# Patient Record
Sex: Male | Born: 2000 | Race: White | Hispanic: No | Marital: Single | Smoking: Never smoker
Health system: Southern US, Community
[De-identification: ages and names within clinical notes are randomized; demographics above are authoritative.]

---
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
## 2021-05-04 ENCOUNTER — Encounter: Payer: Self-pay | Admitting: Emergency Medicine

## 2021-05-04 ENCOUNTER — Ambulatory Visit
Admission: EM | Admit: 2021-05-04 | Discharge: 2021-05-04 | Disposition: A | Discharge status: Home / Self Care | Payer: 59 | Attending: Family Medicine | Admitting: Family Medicine

## 2021-05-04 DIAGNOSIS — R0981 Nasal congestion: Secondary | ICD-10-CM | POA: Diagnosis not present

## 2021-05-04 MED ORDER — CETIRIZINE HCL 10 MG PO TABS: 10.0000 mg | ORAL_TABLET | Freq: Every day | ORAL | 0 refills | Status: AC

## 2021-05-04 MED ORDER — FLUTICASONE PROPIONATE 50 MCG/ACT NA SUSP: 2.0000 | Freq: Every day | NASAL | 2 refills | Status: AC

## 2021-05-04 NOTE — ED Provider Notes (Signed)
[facility]    CSN: [id number] Arrival date & time: 05/04/21  1318      History   Chief Complaint Chief Complaint  Patient presents with  . Nasal Congestion    HPI Justin Shelton is a 20 y.o. male.   Patient reports that he has had nasal congestion for the past couple of weeks.  Also reports intermittent fever 2 weeks ago.  States that last time he had a fever was about a week ago.  Has not attempted OTC treatment.  Denies sick contacts.  Has negative history of COVID.  Has not completed COVID vaccines.  Has not completed flu vaccine.  Denies headache, cough, nausea, vomiting, diarrhea, rash, fever, other symptoms.  ROS per HPI  The history is provided by the patient and a parent.    History reviewed. No pertinent past medical history.  There are no problems to display for this patient.   History reviewed. No pertinent surgical history.     Home Medications    Prior to Admission medications   Medication Sig Start Date End Date Taking? Authorizing Provider  cetirizine (ZYRTEC ALLERGY) 10 MG tablet Take 1 tablet (10 mg total) by mouth daily. 05/04/21  Yes Moshe Cipro, NP  fluticasone (FLONASE) 50 MCG/ACT nasal spray Place 2 sprays into both nostrils daily. 05/04/21  Yes Moshe Cipro, NP    Family History No family history on file.  Social History Social History   Tobacco Use  . Smoking status: Never Smoker  . Smokeless tobacco: Never Used  Substance Use Topics  . Alcohol use: Never  . Drug use: Never     Allergies   Patient has no known allergies.   Review of Systems Review of Systems   Physical Exam Triage Vital Signs ED Triage Vitals  Enc Vitals Group     BP 05/04/21 1405 117/67     Pulse Rate 05/04/21 1405 72     Resp 05/04/21 1405 17     Temp 05/04/21 1405 98.2 F (36.8 C)     Temp Source 05/04/21 1405 Oral     SpO2 05/04/21 1405 98 %     Weight 05/04/21 1403 175 lb (79.4 kg)     Height 05/04/21 1403 6' (1.829  m)     Head Circumference --      Peak Flow --      Pain Score 05/04/21 1403 0     Pain Loc --      Pain Edu? --      Excl. in GC? --    No data found.  Updated Vital Signs BP 117/67 (BP Location: Right Arm)   Pulse 72   Temp 98.2 F (36.8 C) (Oral)   Resp 17   Ht 6' (1.829 m)   Wt 175 lb (79.4 kg)   SpO2 98%   BMI 23.73 kg/m   Visual Acuity Right Eye Distance:   Left Eye Distance:   Bilateral Distance:    Right Eye Near:   Left Eye Near:    Bilateral Near:     Physical Exam Vitals and nursing note reviewed.  Constitutional:      General: He is not in acute distress.    Appearance: Normal appearance. He is well-developed and normal weight. He is not ill-appearing.  HENT:     Head: Normocephalic and atraumatic.     Right Ear: Tympanic membrane, ear canal and external ear normal.     Left Ear: Tympanic membrane, ear canal and external ear  normal.     Nose: Congestion present.     Mouth/Throat:     Mouth: Mucous membranes are moist.     Pharynx: No posterior oropharyngeal erythema.     Comments: Cobblestoning present Eyes:     Extraocular Movements: Extraocular movements intact.     Conjunctiva/sclera: Conjunctivae normal.     Pupils: Pupils are equal, round, and reactive to light.  Cardiovascular:     Rate and Rhythm: Normal rate and regular rhythm.     Heart sounds: Normal heart sounds. No murmur heard.   Pulmonary:     Effort: Pulmonary effort is normal. No respiratory distress.  Musculoskeletal:        General: Normal range of motion.     Cervical back: Normal range of motion and neck supple.  Lymphadenopathy:     Cervical: No cervical adenopathy.  Skin:    General: Skin is warm and dry.     Capillary Refill: Capillary refill takes less than 2 seconds.  Neurological:     General: No focal deficit present.     Mental Status: He is alert and oriented to person, place, and time.  Psychiatric:        Mood and Affect: Mood normal.        Behavior:  Behavior normal.        Thought Content: Thought content normal.      UC Treatments / Results  Labs (all labs ordered are listed, but only abnormal results are displayed) Labs Reviewed - No data to display  EKG   Radiology No results found.  Procedures Procedures (including critical care time)  Medications Ordered in UC Medications - No data to display  Initial Impression / Assessment and Plan / UC Course  I have reviewed the triage vital signs and the nursing notes.  Pertinent labs & imaging results that were available during my care of the patient were reviewed by me and considered in my medical decision making (see chart for details).    Nasal congestion  May take Zyrtec daily May take Flonase 2 sprays per nostril daily as needed for congestion Follow up with this office or with primary care if symptoms are persisting.  Follow up in the ER for high fever, trouble swallowing, trouble breathing, other concerning symptoms.   Final Clinical Impressions(s) / UC Diagnoses   Final diagnoses:  Nasal congestion     Discharge Instructions     I have sent in zyrtec for you to take daily  I have sent in flonase for you to use 2 sprays per nostril daily as needed for nasal congestion.  Follow up with this office or with primary care if symptoms are persisting.  Follow up in the ER for high fever, trouble swallowing, trouble breathing, other concerning symptoms.     ED Prescriptions    Medication Sig Dispense Auth. Provider   cetirizine (ZYRTEC ALLERGY) 10 MG tablet Take 1 tablet (10 mg total) by mouth daily. 30 tablet Moshe Cipro, NP   fluticasone (FLONASE) 50 MCG/ACT nasal spray Place 2 sprays into both nostrils daily. 9.9 mL Moshe Cipro, NP     PDMP not reviewed this encounter.   Moshe Cipro, NP 05/04/21 [phone]

## 2021-05-04 NOTE — Discharge Instructions (Signed)
I have sent in zyrtec for you to take daily  I have sent in flonase for you to use 2 sprays per nostril daily as needed for nasal congestion.  Follow up with this office or with primary care if symptoms are persisting.  Follow up in the ER for high fever, trouble swallowing, trouble breathing, other concerning symptoms.

## 2021-05-04 NOTE — ED Triage Notes (Signed)
Nasal congestion for the past couple of weeks

## 2021-07-06 ENCOUNTER — Ambulatory Visit: Payer: Self-pay | Admitting: Registered Nurse
